# Patient Record
Sex: Male | Born: 1965 | Race: White | Hispanic: No | Smoking: Current every day smoker
Health system: Southern US, Community
[De-identification: ages and names within clinical notes are randomized; demographics above are authoritative.]

## PROBLEM LIST (undated history)

## (undated) DIAGNOSIS — F4310 Post-traumatic stress disorder, unspecified: Secondary | ICD-10-CM

## (undated) DIAGNOSIS — F329 Major depressive disorder, single episode, unspecified: Secondary | ICD-10-CM

## (undated) DIAGNOSIS — F32A Depression, unspecified: Secondary | ICD-10-CM

## (undated) DIAGNOSIS — Z9689 Presence of other specified functional implants: Secondary | ICD-10-CM

---

## 2007-09-30 ENCOUNTER — Encounter: Admission: RE | Admit: 2007-09-30 | Discharge: 2007-09-30 | Payer: Self-pay | Admitting: Family Medicine

## 2008-06-27 IMAGING — CT CT HEAD W/O CM
2 series · 16 of 30 positions shown, 18 images · IV contrast (agent unspecified)
Comparison: None

CLINICAL DATA: Dizziness. Headache and neck stiffness.
TECHNIQUE: 5mm collimated images were obtained from the base of the skull
through the vertex according to standard protocol without contrast.

HEAD CT WITHOUT CONTRAST:

[Series 2: head · axial · 0.49mm/px · z∈[+42,+155]mm · 8 of 28 slices shown, 10 images]
[im 4/28  brain]
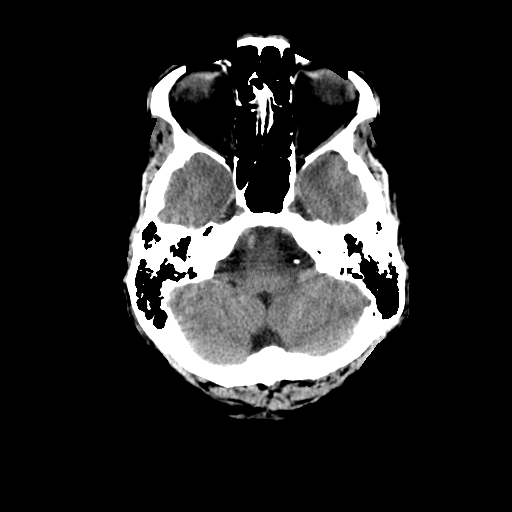
[im 4/28  bone]
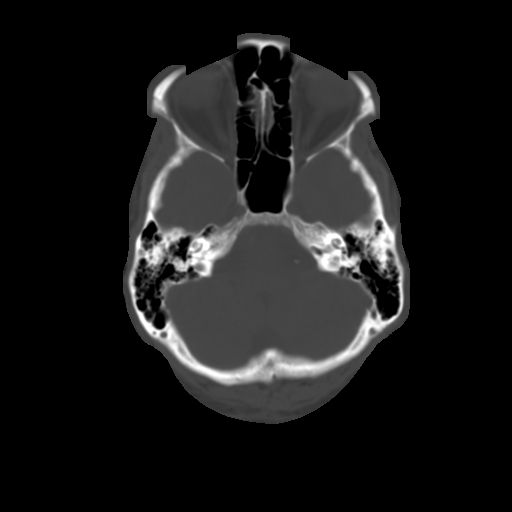
[im 7/28  brain]
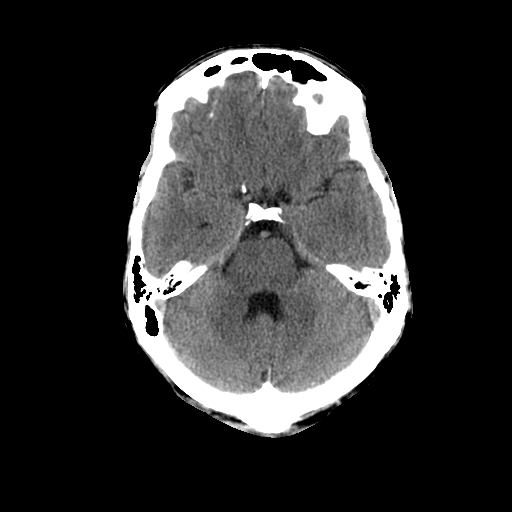
[im 10/28  brain]
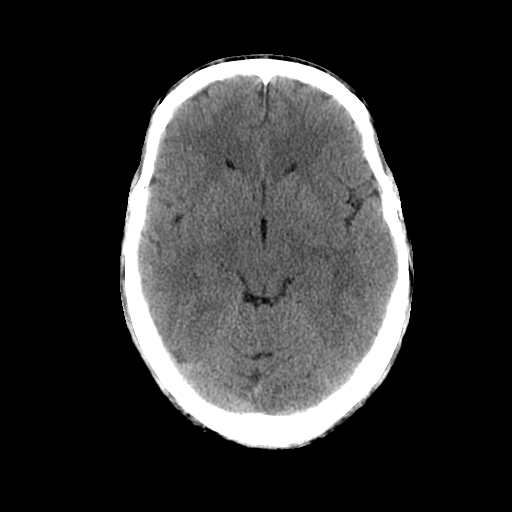
[im 13/28  brain]
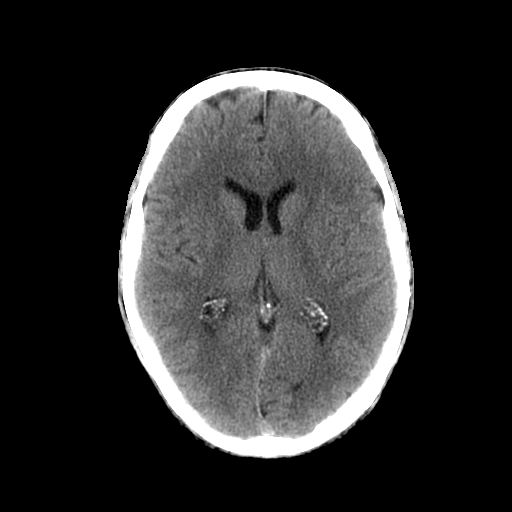
[im 16/28  brain]
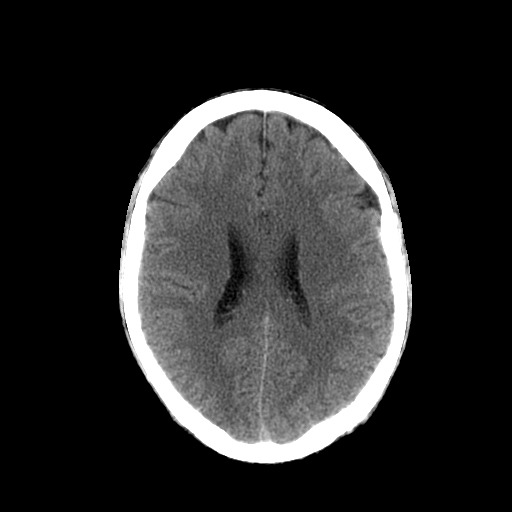
[im 16/28  bone]
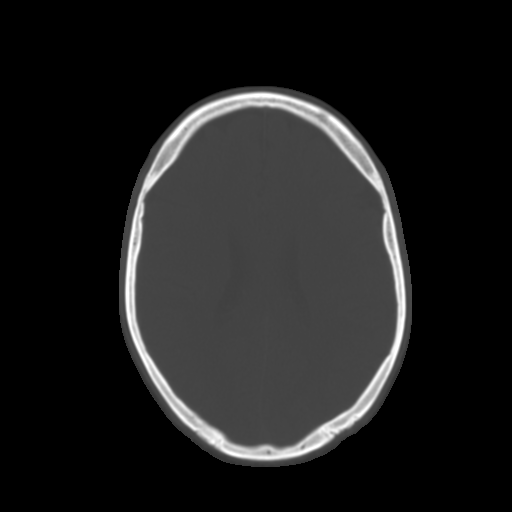
[im 19/28  brain]
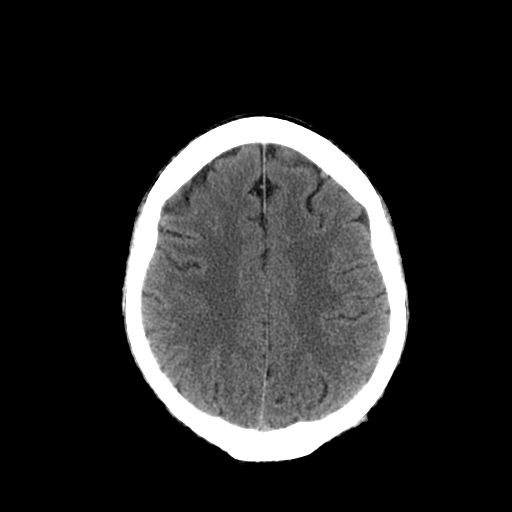
[im 22/28  brain]
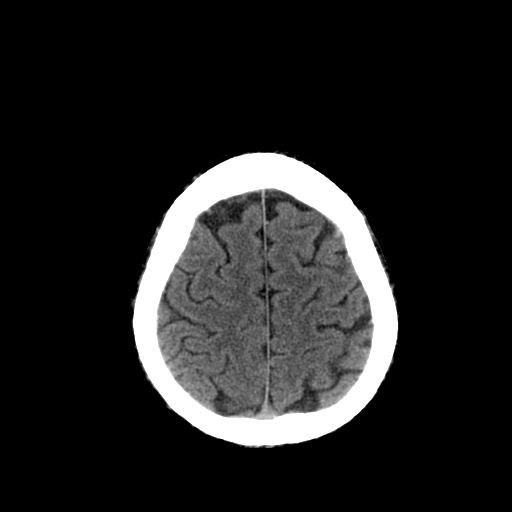
[im 25/28  brain]
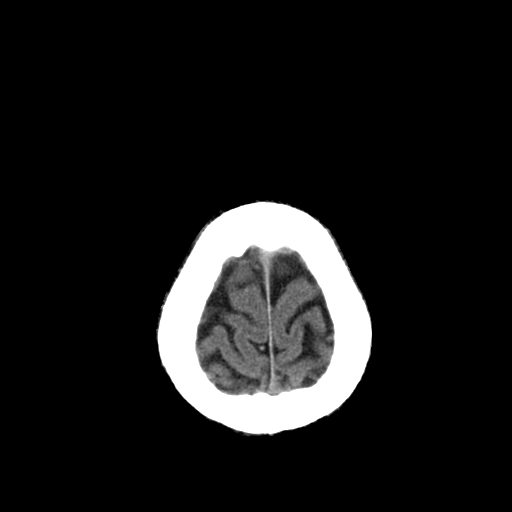

[Series 3: bone windows · axial · 0.49mm/px · z∈[+37,+156]mm · 8 of 56 slices shown]
[im 6/56  bone]
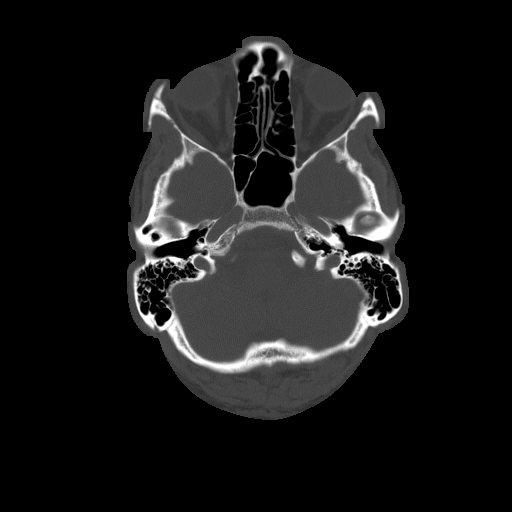
[im 12/56  bone]
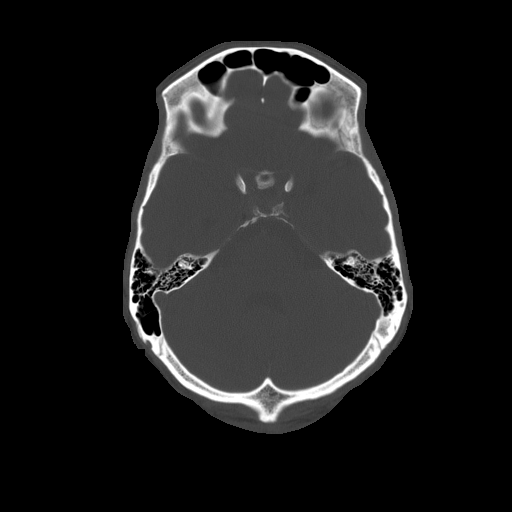
[im 18/56  bone]
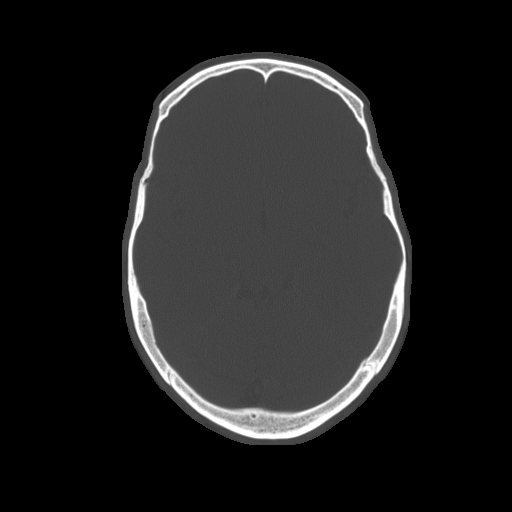
[im 24/56  bone]
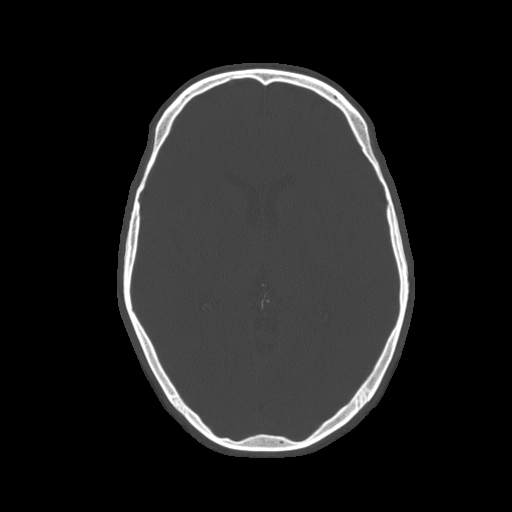
[im 32/56  bone]
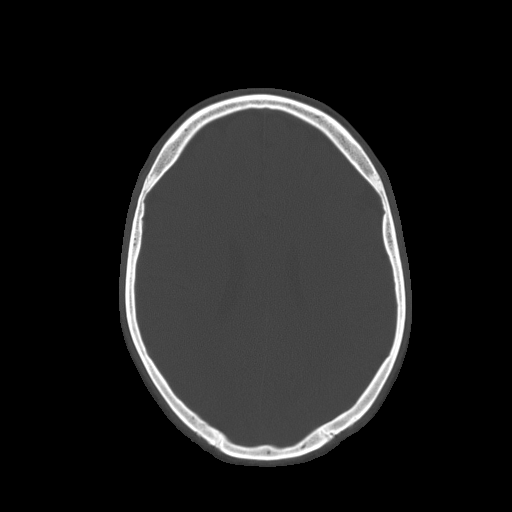
[im 38/56  bone]
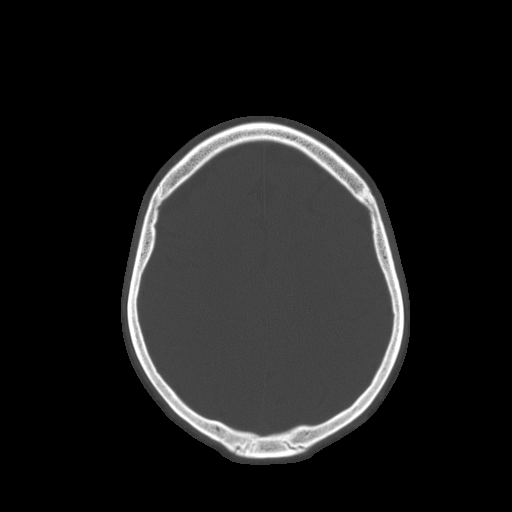
[im 44/56  bone]
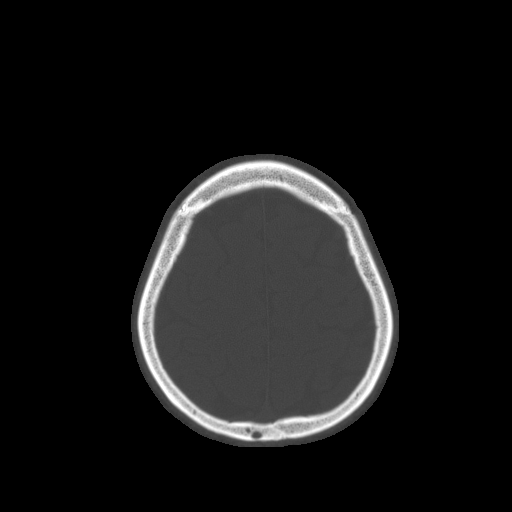
[im 50/56  bone]
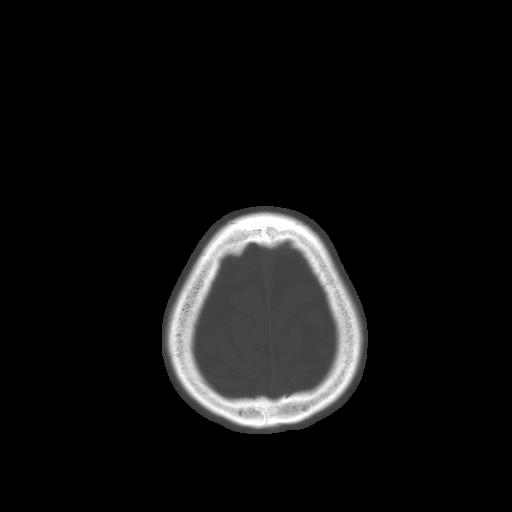

[16 of 30 positions shown; findings below may reference images not displayed]

FINDINGS: There is no evidence for acute hemorrhage, hydrocephalus, mass / mass-effect, or
abnormal intra-axial fluid collection. No definite CT evidence for acute
ischemia. Visualized portions of the paranasal sinuses are clear. The bony
calvarium is unremarkable.
IMPRESSION: No acute intracranial abnormality.

## 2014-10-20 ENCOUNTER — Emergency Department (HOSPITAL_COMMUNITY)
Admission: EM | Admit: 2014-10-20 | Discharge: 2014-10-20 | Disposition: A | Discharge status: Home / Self Care | Payer: Medicare (Managed Care) | Attending: Emergency Medicine | Admitting: Emergency Medicine

## 2014-10-20 ENCOUNTER — Encounter (HOSPITAL_COMMUNITY): Payer: Self-pay | Admitting: *Deleted

## 2014-10-20 DIAGNOSIS — Y9389 Activity, other specified: Secondary | ICD-10-CM | POA: Insufficient documentation

## 2014-10-20 DIAGNOSIS — W57XXXA Bitten or stung by nonvenomous insect and other nonvenomous arthropods, initial encounter: Secondary | ICD-10-CM | POA: Insufficient documentation

## 2014-10-20 DIAGNOSIS — Y998 Other external cause status: Secondary | ICD-10-CM | POA: Diagnosis not present

## 2014-10-20 DIAGNOSIS — Y9289 Other specified places as the place of occurrence of the external cause: Secondary | ICD-10-CM | POA: Diagnosis not present

## 2014-10-20 DIAGNOSIS — S40261A Insect bite (nonvenomous) of right shoulder, initial encounter: Secondary | ICD-10-CM | POA: Insufficient documentation

## 2014-10-20 HISTORY — DX: Major depressive disorder, single episode, unspecified: F32.9

## 2014-10-20 HISTORY — DX: Post-traumatic stress disorder, unspecified: F43.10

## 2014-10-20 HISTORY — DX: Depression, unspecified: F32.A

## 2014-10-20 HISTORY — DX: Presence of other specified functional implants: Z96.89

## 2014-10-20 MED ORDER — DOXYCYCLINE HYCLATE 100 MG PO CAPS: 100.0000 mg | ORAL_CAPSULE | Freq: Two times a day (BID) | ORAL | Status: DC

## 2014-10-20 NOTE — Discharge Instructions (Signed)
Tick Bite Information Ticks are insects that attach themselves to the skin and draw blood for food. There are various types of ticks. Common types include wood ticks and deer ticks. Most ticks live in shrubs and grassy areas. Ticks can climb onto your body when you make contact with leaves or grass where the tick is waiting. The most common places on the body for ticks to attach themselves are the scalp, neck, armpits, waist, and groin. Most tick bites are harmless, but sometimes ticks carry germs that cause diseases. These germs can be spread to a person during the tick's feeding process. The chance of a disease spreading through a tick bite depends on:   The type of tick.  Time of year.   How long the tick is attached.   Geographic location.  HOW CAN YOU PREVENT TICK BITES? Take these steps to help prevent tick bites when you are outdoors:  Wear protective clothing. Long sleeves and long pants are best.   Wear white clothes so you can see ticks more easily.  Tuck your pant legs into your socks.   If walking on a trail, stay in the middle of the trail to avoid brushing against bushes.  Avoid walking through areas with long grass.  Put insect repellent on all exposed skin and along boot tops, pant legs, and sleeve cuffs.   Check clothing, hair, and skin repeatedly and before going inside.   Brush off any ticks that are not attached.  Take a shower or bath as soon as possible after being outdoors.  WHAT IS THE PROPER WAY TO REMOVE A TICK? Ticks should be removed as soon as possible to help prevent diseases caused by tick bites. 1. If latex gloves are available, put them on before trying to remove a tick.  2. Using fine-point tweezers, grasp the tick as close to the skin as possible. You may also use curved forceps or a tick removal tool. Grasp the tick as close to its head as possible. Avoid grasping the tick on its body. 3. Pull gently with steady upward pressure until  the tick lets go. Do not twist the tick or jerk it suddenly. This may break off the tick's head or mouth parts. 4. Do not squeeze or crush the tick's body. This could force disease-carrying fluids from the tick into your body.  5. After the tick is removed, wash the bite area and your hands with soap and water or other disinfectant such as alcohol. 6. Apply a small amount of antiseptic cream or ointment to the bite site.  7. Wash and disinfect any instruments that were used.  Do not try to remove a tick by applying a hot match, petroleum jelly, or fingernail polish to the tick. These methods do not work and may increase the chances of disease being spread from the tick bite.  WHEN SHOULD YOU SEEK MEDICAL CARE? Contact your health care provider if you are unable to remove a tick from your skin or if a part of the tick breaks off and is stuck in the skin.  After a tick bite, you need to be aware of signs and symptoms that could be related to diseases spread by ticks. Contact your health care provider if you develop any of the following in the days or weeks after the tick bite:  Unexplained fever.  Rash. A circular rash that appears days or weeks after the tick bite may indicate the possibility of Lyme disease. The rash may resemble   a target with a bull's-eye and may occur at a different part of your body than the tick bite.  Redness and swelling in the area of the tick bite.   Tender, swollen lymph glands.   Diarrhea.   Weight loss.   Cough.   Fatigue.   Muscle, joint, or bone pain.   Abdominal pain.   Headache.   Lethargy or a change in your level of consciousness.  Difficulty walking or moving your legs.   Numbness in the legs.   Paralysis.  Shortness of breath.   Confusion.   Repeated vomiting.  Document Released: 07/19/2000 Document Revised: 05/12/2013 Document Reviewed: 12/30/2012 [employer] Patient Information 2015 [employer]. This information is  not intended to replace advice given to you by your health care provider. Make sure you discuss any questions you have with your health care provider.  

## 2014-10-20 NOTE — ED Notes (Signed)
Declined W/C at D/C and was escorted to lobby by RN. 

## 2014-10-20 NOTE — ED Provider Notes (Signed)
CSN: [id number]     Arrival date & time 10/20/14  [id number] History  This chart was scribed for non-physician practitioner Teressa LowerVrinda Shraddha Lebron, PA-C working with Arby BarretteMarcy Pfeiffer, MD, by Tanda RockersMargaux Venter, ED Scribe. This patient was seen in room TR10C/TR10C and the patient's care was started at 9:12 AM.     No chief complaint on file.  The history is provided by the patient and a significant other. No language interpreter was used.     HPI Comments: Dillon Hopkins is a 49 y.o. male who presents to the Emergency Department complaining of tick to right shoulder. Pt cannot say when the tick bite occurred. Pt's significant other attempted to pull the tick out of pt's shoulder prior to arrival. Reports that she only got half of it out. He denies fever, chills, or any other symptoms.    No past medical history on file. No past surgical history on file. No family history on file. History  Substance Use Topics  . Smoking status: Not on file  . Smokeless tobacco: Not on file  . Alcohol Use: Not on file    Review of Systems  Constitutional: Negative for fever and chills.  Skin:       Positive for tick bite to right shoulder.   All other systems reviewed and are negative.     Allergies  Review of patient's allergies indicates not on file.  Home Medications   Prior to Admission medications   Not on File   Triage Vitals: BP 126/72 mmHg  Pulse 80  Temp(Src) 97.7 F (36.5 C) (Oral)  Resp 18  SpO2 99%   Physical Exam  Constitutional: He is oriented to person, place, and time. He appears well-developed and well-nourished. No distress.  HENT:  Head: Normocephalic and atraumatic.  Eyes: Conjunctivae and EOM are normal.  Neck: Neck supple. No tracheal deviation present.  Cardiovascular: Normal rate.   Pulmonary/Chest: Effort normal. No respiratory distress.  Musculoskeletal: Normal range of motion.  Neurological: He is alert and oriented to person, place, and time.  Skin: Skin is warm and dry.   Red circular area right shoulder with tick noted to the middle  Psychiatric: He has a normal mood and affect. His behavior is normal.  Nursing note and vitals reviewed.   ED Course  Procedures (including critical care time)  DIAGNOSTIC STUDIES: Oxygen Saturation is 99% on RA, normal by my interpretation.    COORDINATION OF CARE: 9:13 AM-Discussed treatment plan which includes antibiotic prescription with pt at bedside and pt agreed to plan.   Labs Review Labs Reviewed - No data to display  Imaging Review No results found.   EKG Interpretation None      MDM   Final diagnoses:  Tick bite with subsequent removal of tick   Tick removed with tweezers will treat with doxy  I personally performed the services described in this documentation, which was scribed in my presence. The recorded information has been reviewed and is accurate.      Teressa LowerVrinda Guinevere Stephenson, NP 10/20/14 [id number]  Arby BarretteMarcy Pfeiffer, MD 10/22/14 [id number]

## 2014-12-02 ENCOUNTER — Emergency Department (INDEPENDENT_AMBULATORY_CARE_PROVIDER_SITE_OTHER)
Admission: EM | Admit: 2014-12-02 | Discharge: 2014-12-02 | Disposition: A | Discharge status: Home / Self Care | Payer: 59 | Source: Home / Self Care | Attending: Family Medicine | Admitting: Family Medicine

## 2014-12-02 ENCOUNTER — Encounter: Payer: Self-pay | Admitting: Emergency Medicine

## 2014-12-02 DIAGNOSIS — L03011 Cellulitis of right finger: Secondary | ICD-10-CM

## 2014-12-02 MED ORDER — MUPIROCIN 2 % EX OINT: 1.0000 "application " | TOPICAL_OINTMENT | Freq: Three times a day (TID) | CUTANEOUS | Status: AC

## 2014-12-02 NOTE — ED Provider Notes (Signed)
CSN: [id number]     Arrival date & time 12/02/14  [id number] History   First MD Initiated Contact with Patient 12/02/14 [phone]     Chief Complaint  Patient presents with  . Finger Injury      HPI Comments: Patient states that he accidentally hit the tip of his right 4th finger while doing auto repair work one month ago.  He had subsequent subungual hematoma that he drained by drilling several small holes.  Most of the pain has resolved and he removed the original loose nail.  He now complains of several day history of soreness and drainage at the lateral aspect of his nail.  He wonders if the entire nail should be removed.  Patient is a 49 y.o. male presenting with hand pain. The history is provided by the patient.  Hand Pain This is a new problem. The current episode started 2 days ago. The problem occurs constantly. The problem has not changed since onset.Exacerbated by: touching finger. Nothing relieves the symptoms. He has tried nothing for the symptoms.    Past Medical History  Diagnosis Date  . PTSD (post-traumatic stress disorder)   . Depression   . Spinal cord stimulator status    History reviewed. No pertinent past surgical history. Family History  Problem Relation Age of Onset  . Hypertension Mother    History  Substance Use Topics  . Smoking status: Current Every Day Smoker  . Smokeless tobacco: Never Used  . Alcohol Use: No    Review of Systems  All other systems reviewed and are negative.   Allergies  Advil; Keflex; Other; and Toradol  Home Medications   Prior to Admission medications   Medication Sig Start Date End Date Taking? Authorizing Provider  mupirocin ointment (BACTROBAN) 2 % Apply 1 application topically 3 (three) times daily. 12/02/14   Lattie HawStephen A Severus Brodzinski, MD   BP 109/74 mmHg  Pulse 73  Temp(Src) 97.4 F (36.3 C) (Oral)  Resp 16  Ht 6' (1.829 m)  Wt 218 lb (98.884 kg)  BMI 29.56 kg/m2  SpO2 99% Physical Exam  Constitutional: He is oriented to person,  place, and time. He appears well-developed and well-nourished. No distress.  Eyes: Pupils are equal, round, and reactive to light.  Musculoskeletal:       Hands: The radial aspect of right 4th fingernail is erythematous and tender to palpation.  No purulent discharge.  Nail appears to be growing properly without deformity.  Neurological: He is alert and oriented to person, place, and time.  Skin: Skin is warm and dry.  Nursing note and vitals reviewed.   ED Course  Procedures     MDM   1. Paronychia of finger of right hand    Do not recommend removing nail; new nail is advancing properly without deformity of nail bed  Begin Bactroban ointment TID Keep finger bandaged and change daily until healed.  Apply antibiotic ointment with each bandage change. Followup with Family Doctor if not improved in about two weeks.    Lattie HawStephen A Dorthula Bier, MD 12/02/14 [phone]

## 2014-12-02 NOTE — Discharge Instructions (Signed)
Keep finger bandaged and change daily until healed.  Apply antibiotic ointment with each bandage change.   Paronychia Paronychia is an inflammatory reaction involving the folds of the skin surrounding the fingernail. This is commonly caused by an infection in the skin around a nail. The most common cause of paronychia is frequent wetting of the hands (as seen with bartenders, food servers, nurses or others who wet their hands). This makes the skin around the fingernail susceptible to infection by bacteria (germs) or fungus. Other predisposing factors are:  Aggressive manicuring.  Nail biting.  Thumb sucking. The most common cause is a staphylococcal (a type of germ) infection, or a fungal (Candida) infection. When caused by a germ, it usually comes on suddenly with redness, swelling, pus and is often painful. It may get under the nail and form an abscess (collection of pus), or form an abscess around the nail. If the nail itself is infected with a fungus, the treatment is usually prolonged and may require oral medicine for up to one year. Your caregiver will determine the length of time treatment is required. The paronychia caused by bacteria (germs) may largely be avoided by not pulling on hangnails or picking at cuticles. When the infection occurs at the tips of the finger it is called felon. When the cause of paronychia is from the herpes simplex virus (HSV) it is called herpetic whitlow. TREATMENT  When an abscess is present treatment is often incision and drainage. This means that the abscess must be cut open so the pus can get out. When this is done, the following home care instructions should be followed. HOME CARE INSTRUCTIONS   It is important to keep the affected fingers very dry. Rubber or plastic gloves over cotton gloves should be used whenever the hand must be placed in water.  Keep wound clean, dry and dressed as suggested by your caregiver between warm soaks or warm  compresses.  Soak in warm water for fifteen to twenty minutes three to four times per day for bacterial infections. Fungal infections are very difficult to treat, so often require treatment for long periods of time.  For bacterial (germ) infections take antibiotics (medicine which kill germs) as directed and finish the prescription, even if the problem appears to be solved before the medicine is gone.  Only take over-the-counter or prescription medicines for pain, discomfort, or fever as directed by your caregiver. SEEK IMMEDIATE MEDICAL CARE IF:  You have redness, swelling, or increasing pain in the wound.  You notice pus coming from the wound.  You have a fever.  You notice a bad smell coming from the wound or dressing. Document Released: 01/15/2001 Document Revised: 10/14/2011 Document Reviewed: 09/16/2008 [facility] Patient Information 2015 [place], [place]. This information is not intended to replace advice given to you by your health care provider. Make sure you discuss any questions you have with your health care provider.

## 2014-12-02 NOTE — ED Notes (Signed)
States he hit right ring finger nail area while doing an auto repair one month ago; did some DIY treatments to relieve pressure under nail but remains tender and wants remainder of nail removed to prevent infection. Had tetanus booster 3 years ago.
